# Patient Record
Sex: Female | Born: 2005 | Race: Black or African American | Hispanic: No | Marital: Single | Smoking: Never smoker
Health system: Southern US, Community
[De-identification: ages and names within clinical notes are randomized; demographics above are authoritative.]

## PROBLEM LIST (undated history)

## (undated) DIAGNOSIS — J302 Other seasonal allergic rhinitis: Secondary | ICD-10-CM

---
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
## 2024-06-01 ENCOUNTER — Encounter: Payer: Self-pay | Admitting: *Deleted

## 2024-06-01 ENCOUNTER — Ambulatory Visit (INDEPENDENT_AMBULATORY_CARE_PROVIDER_SITE_OTHER)

## 2024-06-01 ENCOUNTER — Ambulatory Visit
Admission: EM | Admit: 2024-06-01 | Discharge: 2024-06-01 | Disposition: A | Discharge status: Home / Self Care | Attending: Family Medicine | Admitting: Family Medicine

## 2024-06-01 ENCOUNTER — Other Ambulatory Visit: Payer: Self-pay

## 2024-06-01 DIAGNOSIS — M79671 Pain in right foot: Secondary | ICD-10-CM | POA: Diagnosis not present

## 2024-06-01 NOTE — Discharge Instructions (Addendum)
 You were seen today for pain to the bottom of the foot.  The xray did not show evidence of any glass in the foot.  I think you have a callus formation that is causing pain.  I recommend you soak the foot in warm epson salt water daily for several weeks.  Once the skin is soft, use a pumice stone to gently soften the area over time.   If this is not improving or worsening, then you  may wish to see a podiatrist.  You may call [facility] at [phone] for an appointment.

## 2024-06-01 NOTE — ED Provider Notes (Signed)
 [facility]    CSN: [id number] Arrival date & time: 06/01/24  1445      History   Chief Complaint Chief Complaint  Patient presents with   Foreign Body in Skin    HPI Susan Beltran is a 18 y.o. female.   Patient is here for possible FB in bottom of right foot.  She stepped on glass about a week ago.  She thought she got all of it out, but now noting a black spot to the skin, and pain/tenderness to the site.        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 No pertinent surgical history.  OB History   No obstetric history on file.      Home Medications    Prior to Admission medications   Medication Sig Start Date End Date Taking? Authorizing Provider  etonogestrel (NEXPLANON) 68 MG IMPL implant Inject into the skin.   Yes [provider]  bacitracin  ointment Apply 1 Application topically 2 (two) times daily. Patient not taking: Reported on 06/01/2024 06/19/23   Bernis Ernst, PA-C  cephALEXin  (KEFLEX ) 500 MG capsule Take 1 capsule (500 mg total) by mouth 4 (four) times daily. Patient not taking: Reported on 06/01/2024 06/19/23   Bernis Ernst, PA-C  cetirizine (ZYRTEC) 10 MG tablet Take 10 mg by mouth daily. Patient not taking: Reported on 08/17/2021    [provider]  cetirizine (ZYRTEC) 10 MG tablet Take 10 mg by mouth daily. Patient not taking: Reported on 06/01/2024    [provider]  Clindamycin -Benzoyl Per, Refr, gel Apply to face each am. Patient not taking: Reported on 06/01/2024 07/24/20   [provider]  dupilumab (DUPIXENT) 300 MG/2ML prefilled syringe  04/14/22   [provider]   EPINEPHrine (EPIPEN 2-PAK) 0.3 mg/0.3 mL IJ SOAJ injection Inject 0.3 mg into the muscle as needed. 01/25/22   [provider]  fluticasone  (FLONASE ) 50 MCG/ACT nasal spray Place 2 sprays into both nostrils daily. Patient not taking: Reported on 08/17/2021 12/08/16   Mortenson, Ashley, MD  Melatonin 5 MG TABS Take 5 mg by mouth at bedtime as needed (for sleep). Patient not taking: Reported on 08/17/2021    [provider]  montelukast  (SINGULAIR ) 10 MG tablet Take 1 tablet (10 mg total) by mouth at bedtime. Patient not taking: Reported on 06/01/2024 01/03/23   Banister, Pamela K, MD  pimecrolimus (ELIDEL) 1 % cream Apply 1 Application topically 2 (two) times daily. Patient not taking: Reported on 06/01/2024 03/04/22   [provider]  tretinoin (RETIN-A) 0.025 % cream Apply topically at bedtime. Patient not taking: Reported on 06/01/2024 07/24/20   [provider]  triamcinolone cream (KENALOG) 0.1 % 1 APPLICATOR APPLIED TO SKIN 1 TO 2 TIMES A DAY Patient not taking: Reported on 06/01/2024 01/25/22   [provider]    Family History History reviewed. No pertinent family history.  Social History Social History   Tobacco Use   Smoking status: Never   Smokeless tobacco: Never  Vaping Use   Vaping status: Never Used  Substance Use Topics   Alcohol use: No   Drug use: Not Currently     Allergies   Patient  has no known allergies.   Review of Systems Review of Systems  Constitutional: Negative.   HENT: Negative.    Respiratory: Negative.    Cardiovascular: Negative.   Gastrointestinal: Negative.   Psychiatric/Behavioral: Negative.       Physical Exam Triage Vital Signs ED Triage Vitals [06/01/24 1520]  Encounter Vitals Group     BP 108/72     Girls Systolic BP Percentile      Girls Diastolic BP Percentile      Boys Systolic BP Percentile      Boys Diastolic BP Percentile      Pulse Rate 84     Resp 16     Temp 98.7 F (37.1 C)      Temp src      SpO2 99 %     Weight      Height      Head Circumference      Peak Flow      Pain Score 7     Pain Loc      Pain Education      Exclude from Growth Chart    No data found.  Updated Vital Signs BP 108/72 (BP Location: Left Arm)   Pulse 84   Temp 98.7 F (37.1 C)   Resp 16   LMP 05/19/2024   SpO2 99%   Visual Acuity Right Eye Distance:   Left Eye Distance:   Bilateral Distance:    Right Eye Near:   Left Eye Near:    Bilateral Near:     Physical Exam Constitutional:      Appearance: Normal appearance. She is normal weight.  Skin:    Comments: To the lateral portion of bottom of the foot, mid way, is an area that is slightly swollen and bruised;  the area is tender;  no drainage or abscess noted;  no redness or warmth is noted  Neurological:     Mental Status: She is alert.      UC Treatments / Results  Labs (all labs ordered are listed, but only abnormal results are displayed) Labs Reviewed - No data to display  EKG   Radiology DG Foot Complete Right Result Date: 06/01/2024 CLINICAL DATA:  Possible foreign body in the lateral plantar foot. EXAM: RIGHT FOOT COMPLETE - 3+ VIEW COMPARISON:  None Available. FINDINGS: There is no evidence of fracture or dislocation. There is no evidence of arthropathy or other focal bone abnormality. Soft tissues are unremarkable. No foreign body identified. IMPRESSION: Negative. Electronically Signed   By: Greig Pique M.D.   On: 06/01/2024 15:43    Procedures Procedures (including critical care time)  Medications Ordered in UC Medications - No data to display  Initial Impression / Assessment and Plan / UC Course  I have reviewed the triage vital signs and the nursing notes.  Pertinent labs & imaging results that were available during my care of the patient were reviewed by me and considered in my medical decision making (see chart for details).  Final Clinical Impressions(s) / UC Diagnoses   Final  diagnoses:  Right foot pain     Discharge Instructions      You were seen today for pain to the bottom of the foot.  The xray did not show evidence of any glass in the foot.  I think you have a callus formation that is causing pain.  I recommend you soak the foot in warm epson salt water daily for several weeks.  Once the skin is  soft, use a pumice stone to gently soften the area over time.   If this is not improving or worsening, then you  may wish to see a podiatrist.  You may call [facility] at [phone] for an appointment.     ED Prescriptions   None    PDMP not reviewed this encounter.   Darral Longs, MD 06/01/24 [phone]

## 2024-06-01 NOTE — ED Triage Notes (Signed)
 Pt reports she got glass in the bottom of her right foot 1 week ago. States she thought she got it all out but she can see a black dot and it hurts when she puts pressure on it.

## 2024-09-24 ENCOUNTER — Encounter: Admitting: Family

## 2024-09-24 NOTE — Progress Notes (Signed)
 Erroneous encounter-disregard

## 2024-10-31 ENCOUNTER — Ambulatory Visit: Admitting: Family
